# Patient Record
Sex: Male | Born: 1980 | Race: White | Hispanic: No | Marital: Married | Smoking: Current every day smoker
Health system: Southern US, Community
[De-identification: ages and names within clinical notes are randomized; demographics above are authoritative.]

## PROBLEM LIST (undated history)

## (undated) DIAGNOSIS — I319 Disease of pericardium, unspecified: Secondary | ICD-10-CM

## (undated) DIAGNOSIS — F32A Depression, unspecified: Secondary | ICD-10-CM

## (undated) DIAGNOSIS — R7303 Prediabetes: Secondary | ICD-10-CM

## (undated) DIAGNOSIS — M4802 Spinal stenosis, cervical region: Secondary | ICD-10-CM

## (undated) DIAGNOSIS — I10 Essential (primary) hypertension: Secondary | ICD-10-CM

## (undated) DIAGNOSIS — F419 Anxiety disorder, unspecified: Secondary | ICD-10-CM

---

## 2016-10-04 ENCOUNTER — Emergency Department: Payer: Managed Care, Other (non HMO)

## 2016-10-04 ENCOUNTER — Emergency Department
Admission: EM | Admit: 2016-10-04 | Discharge: 2016-10-04 | Disposition: A | Discharge status: Home / Self Care | Payer: Managed Care, Other (non HMO) | Attending: Emergency Medicine | Admitting: Emergency Medicine

## 2016-10-04 DIAGNOSIS — M545 Low back pain, unspecified: Secondary | ICD-10-CM

## 2016-10-04 MED ORDER — MORPHINE SULFATE (PF) 4 MG/ML IV SOLN
4.0000 mg | Freq: Once | INTRAVENOUS | Status: AC
  Administered 2016-10-04: 4 mg via INTRAVENOUS
  Filled 2016-10-04: qty 1

## 2016-10-04 MED ORDER — ONDANSETRON HCL 4 MG/2ML IJ SOLN
4.0000 mg | Freq: Once | INTRAMUSCULAR | Status: AC
  Administered 2016-10-04: 4 mg via INTRAVENOUS
  Filled 2016-10-04: qty 2

## 2016-10-04 MED ORDER — HYDROCODONE-ACETAMINOPHEN 5-325 MG PO TABS: 1.0000 | ORAL_TABLET | ORAL | 0 refills | Status: DC | PRN

## 2016-10-04 MED ORDER — PREDNISONE 20 MG PO TABS: 40.0000 mg | ORAL_TABLET | Freq: Every day | ORAL | 0 refills | Status: DC

## 2016-10-04 NOTE — ED Provider Notes (Signed)
-----------------------------------------   8:13 AM on 10/04/2016 -----------------------------------------  Patient care assumed from Dr. Manson PasseyBrown. CT scan of the back is negative for acute injury. 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 Suspect likely musculoskeletal exacerbation. We will place the patient on a short course of pain medication as well has prednisone. I discussed return precautions for worsening pain, weakness, numbness or incontinence. Patient agreeable with plan.   Minna AntisPaduchowski, Leib Elahi, MD 10/04/16 [phone]

## 2016-10-04 NOTE — ED Notes (Signed)
ED Provider at bedside. 

## 2016-10-04 NOTE — ED Notes (Signed)
Patient reports falling off pool ladder at approx 0200 today.

## 2016-10-04 NOTE — ED Provider Notes (Signed)
[facility] Emergency Department Provider Note   First MD Initiated Contact with Patient 10/04/16 [phone]     (approximate)  I have reviewed the triage vital signs and the nursing notes.   HISTORY  Chief Complaint Back Pain   HPI Douglas Garcia is a 36 y.o. male presents with history of falling off the ladder to enter his above ground pool at 2 AM this morning. Patient states it was approximately 4 feet and that he fell on his back. Patient admits to 10 out of 10 nonradiating low back pain worse with ambulation or any movement. Patient denies any lower extremity weakness or numbness. Patient denies any head injury no loss of consciousness.   Past medical history None There are no active problems to display for this patient.   Past surgical history  None  Prior to Admission medications   Not on File    Allergies No known drug allergies   Social History Social History  Substance Use Topics  . Smoking status: Not on file  . Smokeless tobacco: Not on file  . Alcohol use Not on file    Review of Systems Constitutional: No fever/chills Eyes: No visual changes. ENT: No sore throat. Cardiovascular: Denies chest pain. Respiratory: Denies shortness of breath. Gastrointestinal: No abdominal pain.  No nausea, no vomiting.  No diarrhea.  No constipation. Genitourinary: Negative for dysuria. Musculoskeletal: Negative for neck pain.  Positive for back pain. Integumentary: Negative for rash. Neurological: Negative for headaches, focal weakness or numbness.   ____________________________________________   PHYSICAL EXAM:  VITAL SIGNS: ED Triage Vitals  Enc Vitals Group     BP 10/04/16 0543 (!) 162/116     Pulse Rate 10/04/16 0543 (!) 123     Resp 10/04/16 0543 20     Temp 10/04/16 0543 98.6 F (37 C)     Temp Source 10/04/16 0543 Oral     SpO2 10/04/16 0543 94 %     Weight 10/04/16 0543 136.1 kg (300 lb)     Height 10/04/16 0543 1.854 m (6'  1")     Head Circumference --      Peak Flow --      Pain Score 10/04/16 0542 10     Pain Loc --      Pain Edu? --      Excl. in GC? --     Constitutional: Alert and oriented. Apparent discomfort Eyes: Conjunctivae are normal. Head: Atraumatic. Mouth/Throat: Mucous membranes are moist.  Oropharynx non-erythematous. Neck: No stridor.   Cardiovascular: Normal rate, regular rhythm. Good peripheral circulation. Grossly normal heart sounds. Respiratory: Normal respiratory effort.  No retractions. Lungs CTAB. Gastrointestinal: Soft and nontender. No distention.  Musculoskeletal: No lower extremity tenderness nor edema. No gross deformities of extremities. L1-5 pain with palpation. Neurologic:  Normal speech and language. No gross focal neurologic deficits are appreciated.  Skin:  Skin is warm, dry and intact. No rash noted. Psychiatric: Mood and affect are normal. Speech and behavior are normal.    RADIOLOGY I, [place] Shelvy Heckert, personally viewed and evaluated these images (plain radiographs) as part of my medical decision making, as well as reviewing the written report by the radiologist.  No results found.    Procedures   ____________________________________________   INITIAL IMPRESSION / ASSESSMENT AND PLAN / ED COURSE  Pertinent labs & imaging results that were available during my care of the patient were reviewed by me and considered in my medical decision making (see chart for details).  Patient given 4 mg IV morphine and Zofran secondary to 10 out of 10 low back pain. Given pain with palpation CT lumbar spine will be performed which is pending at this time. Patient's care transferred to Dr Lenard LancePaduchowski     ____________________________________________  FINAL CLINICAL IMPRESSION(S) / ED DIAGNOSES  Final diagnoses:  Acute low back pain without sciatica, unspecified back pain laterality     MEDICATIONS GIVEN DURING THIS VISIT:  Medications  morphine 4 MG/ML  injection 4 mg (4 mg Intravenous Given 10/04/16 0644)  ondansetron (ZOFRAN) injection 4 mg (4 mg Intravenous Given 10/04/16 0644)     NEW OUTPATIENT MEDICATIONS STARTED DURING THIS VISIT:  New Prescriptions   No medications on file    Modified Medications   No medications on file    Discontinued Medications   No medications on file     Note:  This document was prepared using Dragon voice recognition software and may include unintentional dictation errors.    Darci CurrentBrown, Devon N, MD 10/04/16 [phone]

## 2017-11-25 IMAGING — CT CT L SPINE W/O CM
3 of 4 series · 13 of 33 positions shown, 16 images · non-contrast
Comparison: None.

CLINICAL DATA: Status post fall ladder.  Back pain.

EXAM:
CT LUMBAR SPINE WITHOUT CONTRAST
TECHNIQUE: Multidetector CT imaging of the lumbar spine was performed without
intravenous contrast administration. Multiplanar CT image
reconstructions were also generated.

[Series 7: sagittal bone · sagittal · 0.45mm/px · 5 of 85 slices shown, 6 images]
[im 29/85  bone]
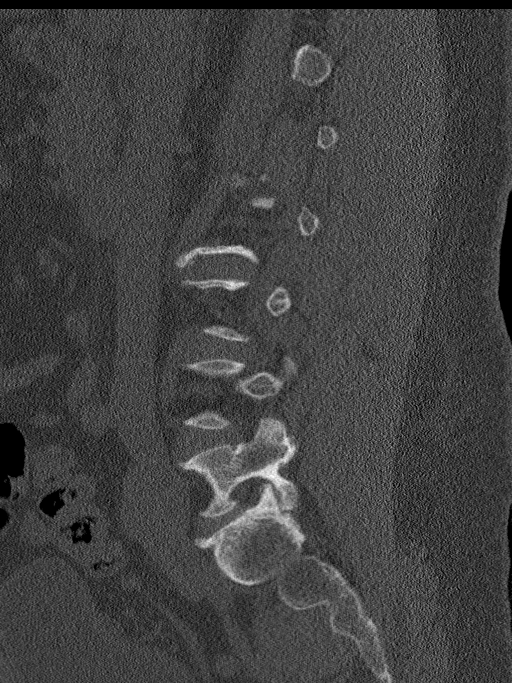
[im 36/85  bone]
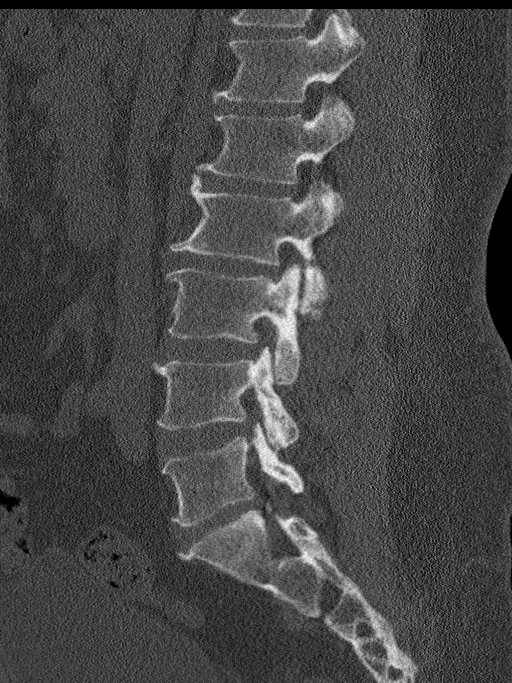
[im 43/85  soft-tissue]
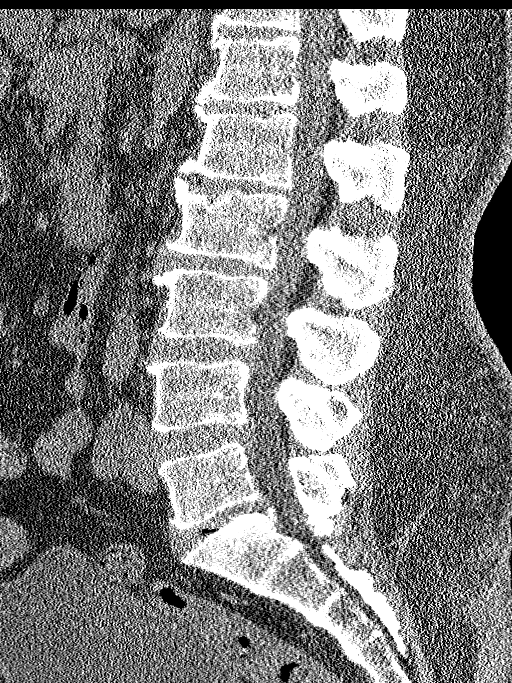
[im 43/85  bone]
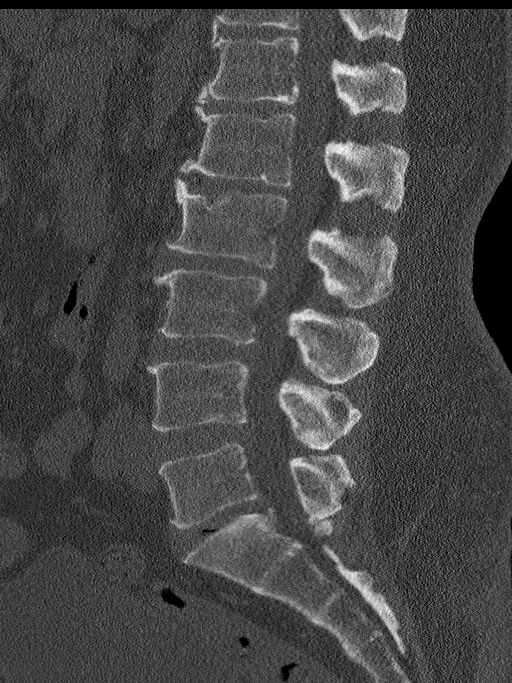
[im 50/85  bone]
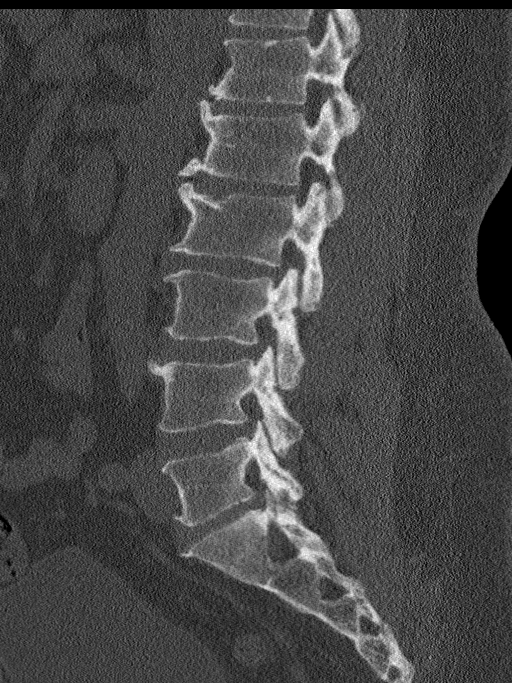
[im 57/85  bone]
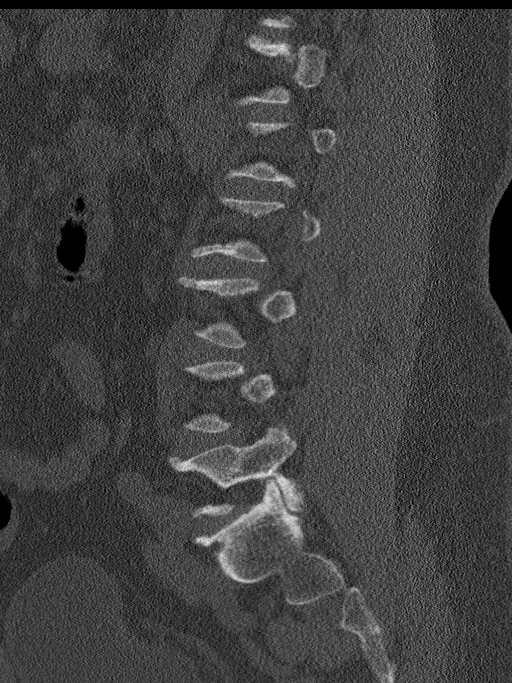

[Series 8: coronal bone · coronal · 0.45mm/px · 3 of 93 slices shown]
[im 19/93  bone]
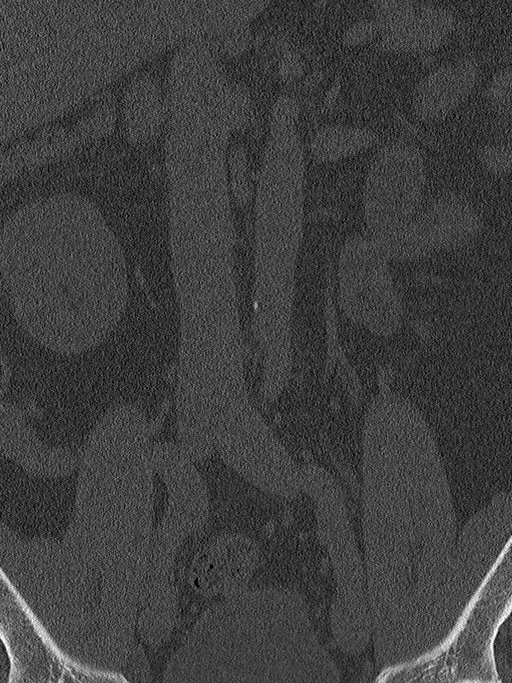
[im 37/93  bone]
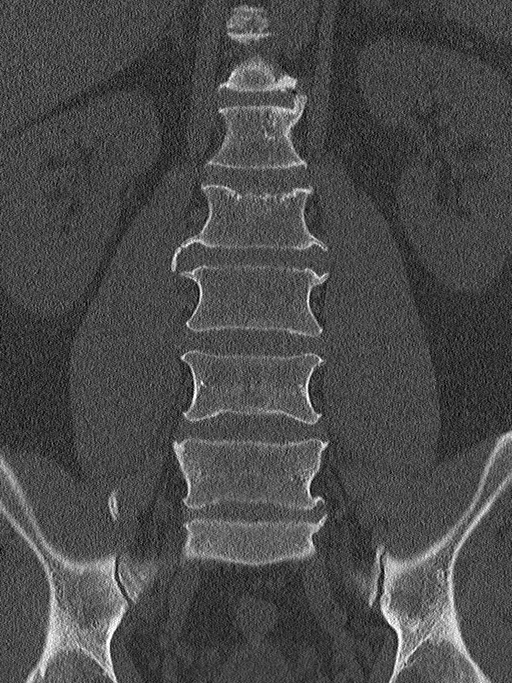
[im 56/93  bone]
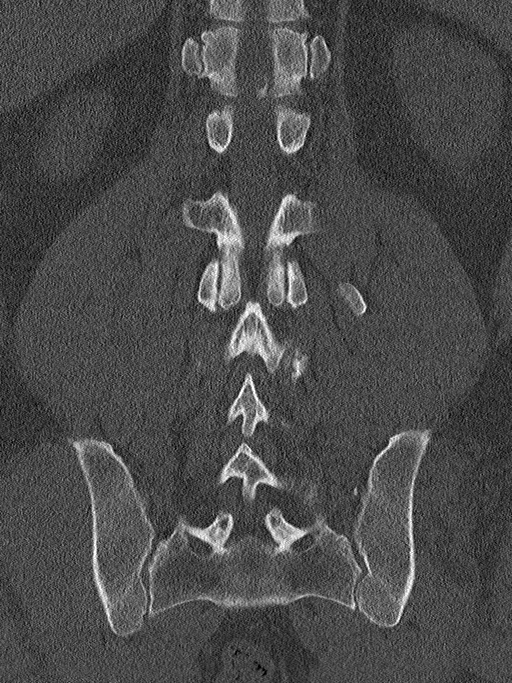

[Series 9: multi disc · axial · 0.21mm/px · z∈[-413,-216]mm · 5 of 137 slices shown, 7 images]
[im 23/137  soft-tissue]
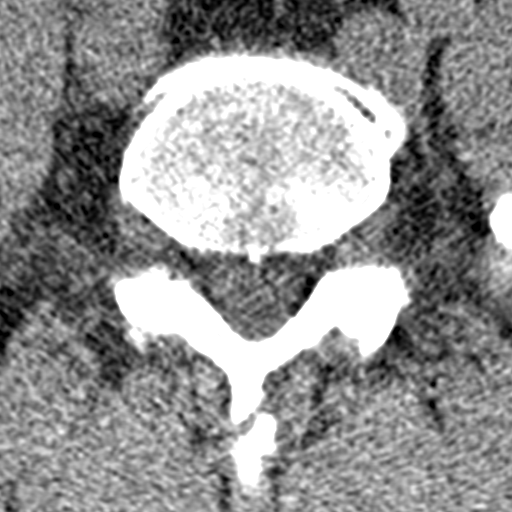
[im 23/137  bone]
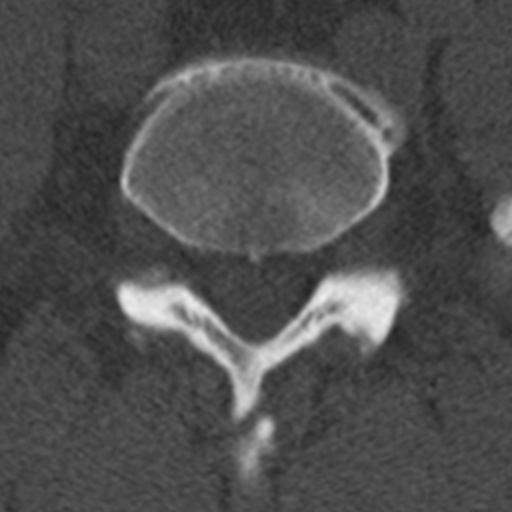
[im 46/137  bone]
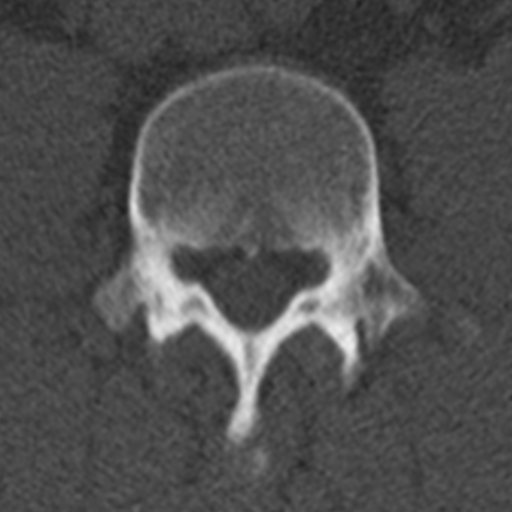
[im 69/137  bone]
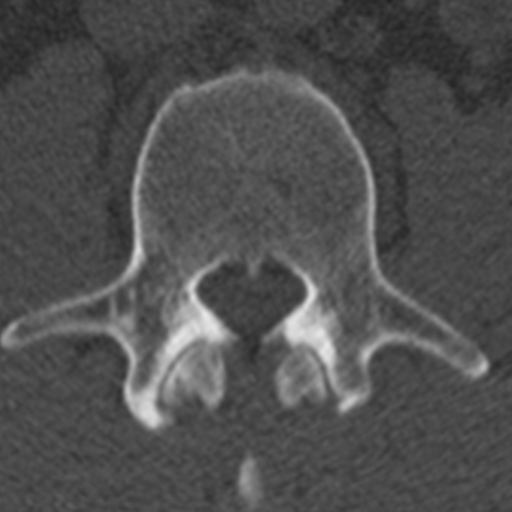
[im 91/137  bone]
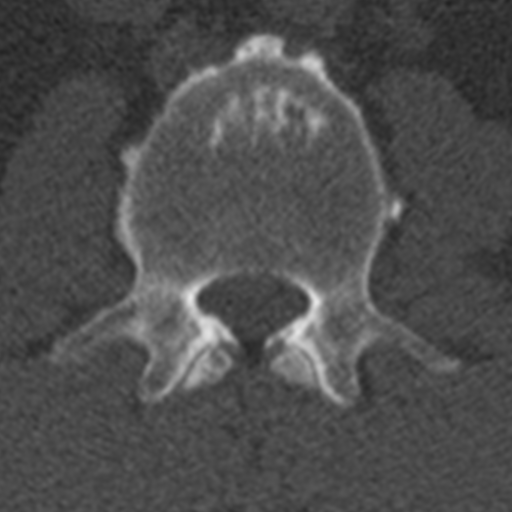
[im 114/137  soft-tissue]
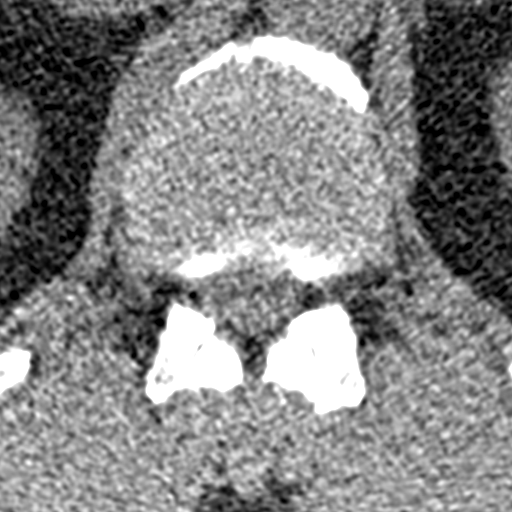
[im 114/137  bone]
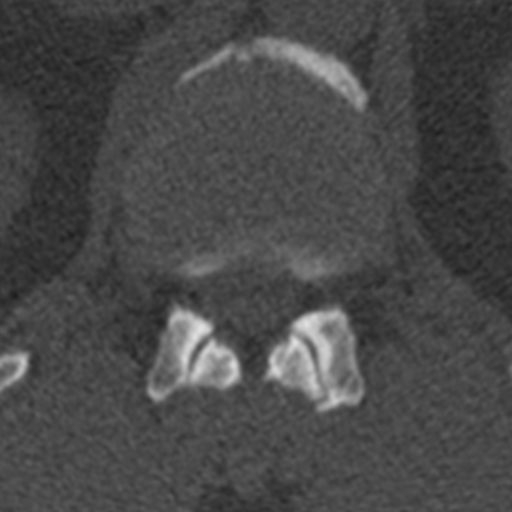

[13 of 33 positions shown; findings below may reference images not displayed]

FINDINGS: Segmentation: 5 lumbar type vertebrae.

Alignment: Normal.

Vertebrae: No acute fracture or focal pathologic process. There is
bilateral osteoarthritis of the sacroiliac joints. New

Paraspinal and other soft tissues: No paraspinal abnormality.
Minimal abdominal aortic atherosclerosis.

Disc levels: Mild degenerative disc disease with disc height loss at
T12-L1, L1-2, L2-3 L5-S1.

At T12-L1 there is a mild broad-based disc osteophyte complex. No
foraminal or central canal stenosis. At L1-2 there is no significant
disc protrusion foraminal stenosis or central canal stenosis. There
is mild bilateral facet arthropathy.

At L2-3 there is a broad-based disc bulge flattening the ventral
thecal sac. There is mild bilateral facet arthropathy. There is no
foraminal stenosis. There is mild spinal stenosis.

At L3-4 there is a mild broad-based disc bulge. There is mild
bilateral facet arthropathy. There is mild right foraminal stenosis.

At L4-5 there is no significant disc protrusion. There is a mild
broad-based disc bulge. There is moderate bilateral facet
arthropathy. There is no foraminal stenosis.

At L5-S1 there is a broad-based disc bulge more focal centrally.
There is moderate left and mild right facet arthropathy. There is no
foraminal stenosis.
IMPRESSION: 1. No acute osseous injury of the lumbar spine.
2. Lumbar spine spondylosis as described above.
3.  Aortic Atherosclerosis ([id number]-170.0)

## 2019-03-08 DIAGNOSIS — I10 Essential (primary) hypertension: Secondary | ICD-10-CM | POA: Insufficient documentation

## 2019-03-09 DIAGNOSIS — I517 Cardiomegaly: Secondary | ICD-10-CM | POA: Insufficient documentation

## 2020-06-11 DIAGNOSIS — Z720 Tobacco use: Secondary | ICD-10-CM | POA: Insufficient documentation

## 2020-06-11 DIAGNOSIS — F419 Anxiety disorder, unspecified: Secondary | ICD-10-CM | POA: Insufficient documentation

## 2021-08-19 DIAGNOSIS — R7303 Prediabetes: Secondary | ICD-10-CM | POA: Insufficient documentation

## 2022-02-24 ENCOUNTER — Other Ambulatory Visit (HOSPITAL_COMMUNITY): Payer: Self-pay | Admitting: Gastroenterology

## 2022-02-24 DIAGNOSIS — R17 Unspecified jaundice: Secondary | ICD-10-CM

## 2022-09-15 ENCOUNTER — Ambulatory Visit: Payer: Managed Care, Other (non HMO) | Admitting: Dietician

## 2023-07-28 DIAGNOSIS — S46211A Strain of muscle, fascia and tendon of other parts of biceps, right arm, initial encounter: Secondary | ICD-10-CM | POA: Insufficient documentation

## 2023-07-28 NOTE — Progress Notes (Signed)
 [facility] Chief Complaint:   Chief Complaint  Patient presents with   Cervical Spine - Pain    History of Present Illness:    Douglas Garcia is a 43 y.o. male that presents to clinic today for evaluation and management of neck and right arm symptoms.    At the time of this visit, there was no information to review.  Cervical Spine The patient reports that the symptoms began gradually without injury 7 weeks ago. The onset of pain was related to unknown. The patient's primary area of pain is in their lower neck, with pain also being present in their upper neck, lower neck and right arm. He describes his primary pain as achy, numbing and tingling, associated pain as achy, numbing and tingling with the timing of the primary pain as frequent and frequent for the associated pain.  Severity of primary pain is severe and moderate for associated pain.  Since onset the pain has stayed the same. On a scale of 0 (no pain) to 10 (worst pain ever), his pain is at a 7/10. lying down improves the pain. He states that sleep and work is/are affected by his pain/problem. He has tried the following prior/current treatments: heat and massage. Heat helps with the pain. Massage does not help with the pain. His last massage therapy session was not in the last 12 months. He has tried muscle relaxants, NSAIDs (anti-inflammatories) and pain meds for this problem. Motrin does not help with the pain. Percocet does not help with the pain. His current work status is working part-time without restrictions.  He denies any previous accident, injury, trauma to his neck, however, he has had a right elbow injury with suspected distal biceps tendon rupture treated nonsurgically because of timing from injury to consultation with the surgeon.  He reports associated numbness and tingling, pain at night.  He denies associated swelling, locking/catching, instability, weakness, fevers or chills,  night sweats weight loss, skin color change, gait change.  He has tried ibuprofen, oxycodone, tramadol, unknown muscle laxer, heat, massage, activity modification with ongoing symptoms.    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  He feels like his symptoms are tolerable but he does make concessions to activities to avoid pain.  Medications, Past Medical/Surgical/Family/Social History:   Current Outpatient Medications  Medication Sig Dispense Refill   cyclobenzaprine (FLEXERIL) 5 MG tablet Take 1 tablet (5 mg total) by mouth 2 (two) times daily as needed for Muscle spasms for up to 10 days 20 tablet 0   predniSONE  (DELTASONE ) 10 MG tablet Take 6 tabs (60mg ) by mouth followed by 5 tabs (50mg ), then 4 tabs (40mg ), then 3 tabs (30mg ), then 2 tabs (20mg ), then 1 tab (10mg ) 21 tablet 0   No current facility-administered medications for this visit.    SECONDARY CONDITIONS THAT INFLUENCE TREATMENT AND DECISION-MAKING:  Former smoker  Past Medical History: Overweight, hypertension, anxiety, depression, history of pericarditis  No relevant past Surgical History  Relevant Orthopedic Family History: Multiple sclerosis, osteoarthritis  Review of Systems:   A 10+ ROS was performed, reviewed, and the pertinent orthopaedic findings are documented in the HPI.    Physical Examination:   BP 120/68   Ht 185.4 cm (6' 1)   Wt 98.4 kg (217 lb)   BMI 28.63 kg/m  General/Constitutional: Well-nourished, well developed, no apparent distress. Psych: Normal mood and affect.  Conversant.  Judgement intact. Lymphatic: No palpable adenopathy. Respiratory: Normal respirations,  no retractions.  Cardiovascular: No edema or swelling. Musculoskeletal: Comprehensive Cervical Spine Exam: Inspection Posture Forward head position with increased cervical lordosis.  Rounded shoulders bilaterally.     Right Left  Skin Normal appearance with no obvious deformity.  No ecchymosis, erythema,  rash, or lesions. Normal appearance with no obvious deformity.  No ecchymosis, erythema, rash, or lesions.  Atrophy None None   Tenderness                                         Right Left  No bony or muscular TTP No bony or muscular TTP    ROM Flexion/Extension Normal  Rotation Normal  Sidebending Normal                    Special Tests   Right Left  Spurling Negative Negative  Hoffman Negative Negative    Motor   Right Left  Shoulder Abduction 5/5 5/5  Elbow Flexion 5/5 5/5  Elbow Extension 5/5 5/5  Wrist Extension 5/5 5/5  Wrist Flexion 5/5 5/5  Finger Abduction 5/5 5/5  Thumb Extension 5/5 5/5  Thumb Flexion 5/5 5/5  Grip Symmetric Symmetric   Sensory   Right Left  Light Touch Normal light touch sensation over C5-T2 dermatomes Normal light touch sensation over C5-T2 dermatomes   Reflexes   Right Left  Biceps 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  Mild straightening of the normal cervical lordosis.  Mild disc change at C5-6 with narrowing and osteophyte formation.  No facet hypertrophic changes.  There are no fractures or dislocations.  No soft tissue swelling.  I personally reviewed and visualized the imaging studies if available. I additionally personally interpreted any radiographs taken during today's visit.  Assessment:     ICD-10-CM  1. Cervicalgia  M54.2  2. Right arm numbness  R20.0  3. Other cervical disc degeneration at C5-C6 level  M50.322    Plan:   I have discussed the nature of his current subjective complaints, clinical examination, test results and have  reviewed treatment options.  The plan is to do the following;  - The patient has ongoing neck and right arm pains without memorable injury.  I discussed the possible causes of his symptoms related to cervical radiculitis versus myofascial pains versus subacromial impingement. - He does not have history concerning for myelopathy.  No abnormalities on neurologic exam.  Good neck range of motion.  X-ray of the cervical spine shows some mild disc changes at C5-6.  I discussed the possible causes of his symptoms with question of cervical radiculitis.  I explained the conservative treatment with activity modification, home exercise, physical therapy, medication prior to further evaluation with MRI for consideration of epidural steroid injection.  He has tried numerous medications with ongoing symptoms so we decided to try different classes. - Activity as tolerated. Modify activity as needed according to symptoms. No limitations to weight bearing.  No need for immobilization or bracing. - Continue home exercise program to maintain  strength, flexibility, and endurance. - I prescribed prednisone  and muscle relaxer.  Use Tylenol , anti-inflammatories, topical pain cream/patch, relative rest, compression, massage, and ice/heat as needed for pain.   - Follow up as needed in 4-6 weeks depending on response to treatment.  He may need low-dose gabapentin versus physical therapy versus cervical spine MRI and physiatry consultation.  Contact our office with any questions or concerns.  Follow up as indicated, or sooner should any new problems arise, if conditions worsen, or if they are otherwise concerned.    Prentice Reges, DO [facility] [address] [place], [place] [address] Phone: [phone]   This note was generated in part with voice recognition software and I apologize for any typographical errors that were not detected and corrected.

## 2023-08-26 ENCOUNTER — Other Ambulatory Visit: Payer: Self-pay | Admitting: Sports Medicine

## 2023-08-26 DIAGNOSIS — M5412 Radiculopathy, cervical region: Secondary | ICD-10-CM

## 2023-08-26 DIAGNOSIS — M50322 Other cervical disc degeneration at C5-C6 level: Secondary | ICD-10-CM

## 2023-09-01 ENCOUNTER — Ambulatory Visit
Admission: RE | Admit: 2023-09-01 | Discharge: 2023-09-01 | Disposition: A | Discharge status: Home / Self Care | Source: Ambulatory Visit | Attending: Sports Medicine | Admitting: Sports Medicine

## 2023-09-01 DIAGNOSIS — M5412 Radiculopathy, cervical region: Secondary | ICD-10-CM | POA: Diagnosis present

## 2023-09-01 DIAGNOSIS — M50322 Other cervical disc degeneration at C5-C6 level: Secondary | ICD-10-CM | POA: Diagnosis present

## 2023-10-31 NOTE — Progress Notes (Signed)
 Subjective:     Patient ID: Douglas Garcia is a 43 y.o. male.  Back Pain  : PRESENTS WITH RIGHT SIDED LOW BACK PAIN. REPORTS  HE WAS MOVING SOME FURNATURE A WK AGO. NON RADIATING. NO CHANGE IN BOWEL OR BLADDER CONTROL. WORSENED YESTERDAY . HA APPLIED ICE. HX OF CERVICAL STENOSIS . NO REAL LOW BACK ISSUES. RECORDS REVIEWED.   Past Medical History:  has a past medical history of Anxiety, Depression, Hypertension, Pericardial effusion ([facility]) (02/2019), Pericardial effusion without cardiac tamponade ([facility]) (03/08/2019), and Pericarditis, acute ([facility]) (03/08/2019). Past Surgical History:  has a past surgical history that includes Vasectomy (2019). Family History: family history includes ADD / ADHD in his son; Breast cancer in his maternal grandmother; High blood pressure (Hypertension) in his mother; Multiple sclerosis in his sister; Myocardial Infarction (Heart attack) in his maternal grandfather and paternal grandfather; No Known Problems in his father, paternal grandmother, and son; Obesity in his mother; Osteoarthritis in his mother. Social History:  reports that he quit smoking about 2 years ago. His smoking use included cigarettes. He started smoking about 26 years ago. He has a 24 pack-year smoking history. He has never used smokeless tobacco. He reports that he does not currently use alcohol. He reports current drug use. Drug: Other-see comments. Prior to encounter Medications:  Current Outpatient Medications on File Prior to Visit  Medication Sig Dispense Refill   gabapentin (NEURONTIN) 300 MG capsule Take 1 capsule (300 mg total) by mouth at bedtime for 30 days (Patient not taking: Reported on 10/31/2023) 30 capsule 0   pregabalin (LYRICA) 50 MG capsule 1 po qHS (Patient not taking: Reported on 10/31/2023) 30 capsule 0   No current facility-administered medications on file prior to visit.   Allergies: has no known allergies.  This an established patient is here today for: office  visit.  Review of Systems  Musculoskeletal:  Positive for back pain.       Objective:   Physical Exam Vitals (HT AND WT MIS MEASURES. PATIENT GO BEFORE RECHECK) reviewed.  Constitutional:      General: He is not in acute distress.    Appearance: Normal appearance. He is not ill-appearing or toxic-appearing.  Cardiovascular:     Rate and Rhythm: Normal rate.  Pulmonary:     Effort: Pulmonary effort is normal. No respiratory distress.     Breath sounds: No wheezing.  Musculoskeletal:     Comments: PAIN RIGHT QUADRATUS AREA.  HEEL TOE WALK INTACT  PAIN WITH FLEX TO 20 DEGREES AND WITH EXTENSION PAIN WITH SBR AND ROTATION LEFT  NEG SLR   Neurological:     Mental Status: He is alert.        Assessment:     1. Lumbar strain, initial encounter   -     predniSONE  (DELTASONE ) 20 MG tablet; Take 1 tablet (20 mg total) by mouth 2 (two) times daily for 5 days -     baclofen (LIORESAL) 10 MG tablet; 1 PO TID PRN -     traMADoL (ULTRAM) 50 mg tablet; Take 1 tablet (50 mg total) by mouth every 6 (six) hours as needed for up to 10 days     Plan:     Patient Instructions  Medications as discussed and directed  Apply heat three times daily x 15 min Do not start stretching until pain free , then only to the point of pain  Follow up with physiatry if not resolving.

## 2023-11-04 NOTE — Progress Notes (Signed)
 HPI The patient is a pleasant 43 year old right hand dominate gentleman followed by Dr.Kubisnki who presents today for follow-up of acute on chronic bilateral neck pain with more notable right lateral neck pain with radiation to the bilateral shoulders and intermittent diffuse numbness and tingling in his right arm.  His pain began in March 2025 without injury.  He did begin working at [employer] at this time but states that his symptoms began before working at [employer].  He rates his pain is moderate to severe that is intermittent with associated numbness and tingling unchanged with onset.  His pain is worse with standing, lifting, sitting.  Lying in bed can help to alleviate pain.  Pain is not interrupting rest.He denies any difficulty with fine motor task change in balance.   Medications have included Flexeril (no relief with stomach pain), prednisone  (good temporary relief), ibuprofen (mild relief), gabapentin caused drowsiness was not beneficial.  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  He has 2 boys ages 66 and 65.  He was initially evaluated on 09/22/2023 at which time he was experiencing acute on chronic bilateral neck pain right greater than left extending to the shoulders and tingling in the right arm.  He was started on Lyrica and [facility]  At today's visit he states he has 60% improvement of cervical spine pain with physical therapy.  He is planning to have another visit today.  He intermittently will experience a few seconds of numbness in the right upper extremity we discussed again epidural steroid injection he prefers to hold off at this time.  No difficulty with fine motor tasks.  He was evaluated and 10/31/2023 at the walk-in clinic for low back pain.  He was prescribed baclofen and prednisone  and states that this has been helpful along with tramadol.  He reports improvement of pain he is not interested in any further workup.  Denies any loss control of bowel  or bladder or saddle anesthesia.  Denies any fever or chills or night sweats or unintentional weight loss.  He also states he has intermittent right groin pain that will catch but he is not experiencing this now.  The   Narcotic Database was reviewed today.   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  Tobacco Use   Smoking status: Former    Current packs/day: 0.00    Average packs/day: 1 pack/day for 24.0 years (24.0 ttl pk-yrs)    Types: Cigarettes    Start date: 12/20/1996    Quit date: 12/2020    Years since quitting: 2.8   Smokeless tobacco: Never  Vaping Use   Vaping status: Never Used  Substance and Sexual Activity   Alcohol use: Not Currently    Comment: quit 2020   Drug use: Yes    Types: Other-see comments    Comment: marijuana couple times per week   Sexual activity: Yes    Partners: Female    Birth control/protection: Surgical    Comment: vasectomy   Social Drivers of Health   Financial Resource Strain: Low Risk  (10/31/2023)  Overall Financial Resource Strain (CARDIA)    Difficulty of Paying Living Expenses: Not hard at all  Food Insecurity: No Food Insecurity (10/31/2023)   Hunger Vital Sign    Worried About Running Out of Food in the Last Year: Never true    Ran Out of Food in the Last Year: Never true  Transportation Needs: No Transportation Needs (10/31/2023)   PRAPARE - Administrator, Civil Service (Medical): No    Lack of Transportation (Non-Medical): No    Family History  Problem Relation Name Age of Onset   High  blood pressure (Hypertension) Mother     Obesity Mother     Osteoarthritis Mother     No Known Problems Father     Multiple sclerosis Sister     ADD / ADHD Son Katrinka    Breast cancer Maternal Grandmother     Myocardial Infarction (Heart attack) Maternal Grandfather     No Known Problems Paternal Grandmother     Myocardial Infarction (Heart attack) Paternal Grandfather     No Known Problems Son Feliciano     Current Outpatient Medications on File Prior to Visit  Medication Sig Dispense Refill   baclofen (LIORESAL) 10 MG tablet 1 PO TID PRN 15 tablet 0   traMADoL (ULTRAM) 50 mg tablet Take 1 tablet (50 mg total) by mouth every 6 (six) hours as needed for up to 10 days 20 tablet 0   gabapentin (NEURONTIN) 300 MG capsule Take 1 capsule (300 mg total) by mouth at bedtime for 30 days (Patient not taking: Reported on 11/04/2023) 30 capsule 0   No current facility-administered medications on file prior to visit.    Allergies as of 11/04/2023   (No Known Allergies)    ROS More than 10 system, review of system form was given to the patient to fill out and has been signed by [employer] FNP-C and scanned into the patient's chart.   Vital signs Vitals:   11/04/23 0822  BP: 139/84  Pulse: 77  Temp: 36.6 C (97.8 F)  TempSrc: Oral  Weight: 83 kg (182 lb 15.7 oz)  Height: 177.8 cm (5' 10)  PainSc:   3  PainLoc: Back    Exam General: Alert oriented well-nourished no distress   Cervical Exam (performed 11/04/2023) Upon inspection there are no rashes or scars.  He is without notable tenderness to palpation to the cervical paraspinal musculature.  Cervical rotation is full.  Spurling's maneuver produces some left lateral neck pain.  Upper Extremity Exam (11/04/2023) He has 5/5 strength in bilateral wrist extensors, biceps, triceps, deltoids, shoulder internal and external rotators.  Sensation is intact to light touch bilaterally.  Negative Hoffmann's bilaterally.  Unable to  elicit bilateral bicep and tricep temperature as reflexes bilaterally.   Lumbar exam (11/04/2023) Upon inspection there are no rashes or scars.  He has mild tenderness of ovation to the right lumbosacral paraspinal musculature.  Extension rotation produces right groin pain.  Lower Extremity Exam He has 5/5 strength in bilateral dorsiflexors, knee extensors and hip flexors.  Sensation is intact to light touch bilaterally.  Trace bilateral patellar reflexes.  Straight leg raise negative bilaterally.  He has full range of motion to the bilateral hip joints without pain elicited.   Radiographic Data   Impression 1.  Acute on chronic bilateral neck pain with more notable right lateral neck pain with radiation to the bilateral shoulders and intermittent diffuse numbness and tingling in his right armMRI CERVICAL SPINE WITHOUT CONTRAST  TECHNIQUE:  Multiplanar, multisequence MR imaging of the cervical spine was  performed. No intravenous contrast was administered.  COMPARISON:  None Available.  FINDINGS:  Alignment: Straightening of the normal cervical lordosis. No  listhesis.  Vertebrae: Vertebral body height maintained without acute or chronic  fracture. Decreased T1 signal intensity within the visualized bone  marrow, nonspecific, but most commonly related to anemia, smoking or  obesity. No discrete or worrisome osseous lesions. Mild degenerative  reactive endplate changes present about the C5-6 interspace. No  other abnormal marrow edema.  Cord: Normal signal and morphology.  Posterior Fossa, vertebral arteries, paraspinal tissues:  Unremarkable.  Disc levels:  C2-C3: Unremarkable.  C3-C4: Small central disc protrusion minimally indents the ventral  thecal sac. No spinal stenosis. Foramina remain patent.  C4-C5: Tiny right paracentral disc protrusion minimally indents the  ventral thecal sac. Mild right-sided uncovertebral spurring. No  spinal stenosis. Foramina remain adequately  patent.  C5-C6: Degenerative intervertebral disc space narrowing with diffuse  disc osteophyte complex, asymmetric to the right. Posterior  component flattens and effaces the ventral thecal sac. Secondary  cord flattening, asymmetric to the right. No cord signal changes.  Resultant moderate canal with severe right worse than left C6  foraminal stenosis.  C6-C7: Tiny right paracentral disc protrusion minimally indents the  right ventral thecal sac (series 8, image 29). No significant spinal  stenosis. Foramina remain patent.  C7-T1: Normal interspace. Mild left-sided facet hyper no canal or  foraminal stenosis.  IMPRESSION:  1. Right eccentric degenerative disc osteophyte complex at C5-6 with  resultant moderate canal and severe right worse than left C6  foraminal stenosis.  2. Small right paracentral disc protrusions at C4-5 and C6-7 without  significant stenosis or overt neural impingement.  Electronically Signed    By: Morene Hoard M.D.    On: 09/06/2023 16:14    2.  Hypertension 3.  Anxiety depression 4.  Tobacco abuse  5.  Acute bilateral low back pain without rating pain to the lower extremities.  Clinically his symptoms were most consistent with muscle spasm.CT lumbar spine without contrast dated 10/04/2016 revealed multilevel degenerative changes and aortic atherosclerosis.  6.  Intermittent right groin pain.  Clinically his symptoms are most likely an element of right hip mediated pain.  Plan 1.  He discontinued Lyrica 50mg   was not helpful 2.  Continue with physical therapy  [facility]  3.  In the past Flexeril has caused urinary retention we will avoid muscle relaxants. 4.  We discussed xrays of back and right hip and he would like to hold off. He can call if he would like to move forward with these.  5.  Today we discussed epidural steroid injection.  If he was to wish to move forward with this he could call to schedule for right C5-6 versus C6-7  transforaminal ESI x 1.  We will prescribe Valium. 6.  He would like to follow-up as needed today.  Social Drivers of Health with Concerns   Tobacco Use: Medium Risk (10/31/2023)   Patient History    Smoking Tobacco Use: Former    Smokeless Tobacco Use: Never  Alcohol Use: Alcohol Misuse (10/31/2023)   AUDIT-C    Frequency of Alcohol Consumption: Monthly or less    Average Number of Drinks: 3 or 4    Frequency of Binge Drinking: Weekly  Depression: Moderately severe depression (08/19/2021)   PHQ-9    PHQ-9 Score: 16      Attestation Statement:   I personally  performed the service, non-incident to. (WP)   WHITNEY MEELER, NP    BENTON DOWSE, NP  This note was generated in part with voice recognition software and I apologize for any typographical errors that were not detected and corrected.  PCP: General

## 2024-03-21 ENCOUNTER — Ambulatory Visit: Admission: EM | Admit: 2024-03-21 | Discharge: 2024-03-21 | Disposition: A | Discharge status: Home / Self Care

## 2024-03-21 ENCOUNTER — Emergency Department
Admission: EM | Admit: 2024-03-21 | Discharge: 2024-03-21 | Disposition: A | Discharge status: Home / Self Care | Source: Ambulatory Visit

## 2024-03-21 ENCOUNTER — Other Ambulatory Visit: Payer: Self-pay

## 2024-03-21 DIAGNOSIS — M25551 Pain in right hip: Secondary | ICD-10-CM | POA: Diagnosis not present

## 2024-03-21 DIAGNOSIS — I10 Essential (primary) hypertension: Secondary | ICD-10-CM | POA: Diagnosis not present

## 2024-03-21 DIAGNOSIS — M5410 Radiculopathy, site unspecified: Secondary | ICD-10-CM

## 2024-03-21 DIAGNOSIS — M79604 Pain in right leg: Secondary | ICD-10-CM | POA: Diagnosis present

## 2024-03-21 DIAGNOSIS — R1031 Right lower quadrant pain: Secondary | ICD-10-CM | POA: Diagnosis not present

## 2024-03-21 DIAGNOSIS — M5431 Sciatica, right side: Secondary | ICD-10-CM | POA: Diagnosis not present

## 2024-03-21 HISTORY — DX: Disease of pericardium, unspecified: I31.9

## 2024-03-21 HISTORY — DX: Depression, unspecified: F32.A

## 2024-03-21 HISTORY — DX: Spinal stenosis, cervical region: M48.02

## 2024-03-21 HISTORY — DX: Essential (primary) hypertension: I10

## 2024-03-21 HISTORY — DX: Anxiety disorder, unspecified: F41.9

## 2024-03-21 HISTORY — DX: Prediabetes: R73.03

## 2024-03-21 MED ORDER — KETOROLAC TROMETHAMINE 15 MG/ML IJ SOLN
15.0000 mg | Freq: Once | INTRAMUSCULAR | Status: AC
  Administered 2024-03-21: 15 mg via INTRAMUSCULAR
  Filled 2024-03-21: qty 1

## 2024-03-21 MED ORDER — PREDNISONE 20 MG PO TABS
60.0000 mg | ORAL_TABLET | Freq: Once | ORAL | Status: AC
  Administered 2024-03-21: 60 mg via ORAL
  Filled 2024-03-21: qty 3

## 2024-03-21 MED ORDER — PREDNISONE 20 MG PO TABS: 20.0000 mg | ORAL_TABLET | Freq: Two times a day (BID) | ORAL | 0 refills | Status: AC

## 2024-03-21 MED ORDER — METHOCARBAMOL 500 MG PO TABS: 500.0000 mg | ORAL_TABLET | Freq: Four times a day (QID) | ORAL | 0 refills | Status: AC

## 2024-03-21 MED ORDER — OXYCODONE-ACETAMINOPHEN 5-325 MG PO TABS
2.0000 | ORAL_TABLET | Freq: Once | ORAL | Status: AC
  Administered 2024-03-21: 2 via ORAL
  Filled 2024-03-21: qty 2

## 2024-03-21 MED ORDER — OXYCODONE-ACETAMINOPHEN 7.5-325 MG PO TABS: 1.0000 | ORAL_TABLET | ORAL | 0 refills | Status: AC | PRN

## 2024-03-21 NOTE — ED Triage Notes (Signed)
 Pt presents to ED from urgent care C/O R hip pain radiating down R leg X 3 days. Worse with movement/walking. Denies known injury.

## 2024-03-21 NOTE — ED Provider Notes (Signed)
 " [facility]    CSN: [id number] Arrival date & time: 03/21/24  9178      History   Chief Complaint Chief Complaint  Patient presents with   Leg Pain    HPI Douglas Garcia. is a 43 y.o. male.  Patient presents with 3-day history of right hip and groin pain which radiates down his right leg.  No trauma.  He woke up with the pain.  He has attempted treatment with ibuprofen and gabapentin without relief.  His pain occurs when he changes position, walks, stands.  It improves with rest.  He denies numbness, weakness, saddle anesthesia, loss of bowel/bladder control, fever, abdominal pain, dysuria, hematuria, flank pain, testicular pain.  His medical history includes cervical spinal stenosis, pericarditis, hypertension, current smoker, anxiety, depression, prediabetes.  The history is provided by the patient and medical records.    Past Medical History:  Diagnosis Date   Anxiety    Cervical spinal stenosis    Depression    Hypertension    Pericarditis    Prediabetes     There are no active problems to display for this patient.   History reviewed. No pertinent surgical history.     Home Medications    Prior to Admission medications  Medication Sig Start Date End Date Taking? Authorizing Provider  HYDROcodone -acetaminophen  (NORCO/VICODIN) 5-325 MG tablet Take 1 tablet by mouth every 4 (four) hours as needed. Patient not taking: Reported on 03/21/2024 10/04/16   Dorothyann Drivers, MD  predniSONE  (DELTASONE ) 20 MG tablet Take 2 tablets (40 mg total) by mouth daily. Patient not taking: Reported on 03/21/2024 10/04/16   Dorothyann Drivers, MD    Family History History reviewed. No pertinent family history.  Social History Social History[1]   Allergies   Patient has no known allergies.   Review of Systems Review of Systems  Constitutional:  Negative for chills and fever.  Gastrointestinal:  Negative for abdominal pain, constipation, diarrhea, nausea and  vomiting.  Genitourinary:  Negative for decreased urine volume, dysuria, hematuria, scrotal swelling and testicular pain.  Musculoskeletal:  Positive for arthralgias and gait problem. Negative for joint swelling.  Skin:  Negative for color change, rash and wound.  Neurological:  Negative for weakness and numbness.     Physical Exam Triage Vital Signs ED Triage Vitals  Encounter Vitals Group     BP 03/21/24 0851 (!) 151/101     Girls Systolic BP Percentile --      Girls Diastolic BP Percentile --      Boys Systolic BP Percentile --      Boys Diastolic BP Percentile --      Pulse Rate 03/21/24 0851 92     Resp 03/21/24 0851 18     Temp 03/21/24 0851 98.2 F (36.8 C)     Temp src --      SpO2 03/21/24 0851 98 %     Weight --      Height --      Head Circumference --      Peak Flow --      Pain Score 03/21/24 0853 3     Pain Loc --      Pain Education --      Exclude from Growth Chart --    No data found.  Updated Vital Signs BP (!) 151/101   Pulse 92   Temp 98.2 F (36.8 C)   Resp 18   SpO2 98%   Visual Acuity Right Eye Distance:  Left Eye Distance:   Bilateral Distance:    Right Eye Near:   Left Eye Near:    Bilateral Near:     Physical Exam Constitutional:      General: He is not in acute distress. HENT:     Mouth/Throat:     Mouth: Mucous membranes are moist.  Cardiovascular:     Rate and Rhythm: Normal rate.  Pulmonary:     Effort: Pulmonary effort is normal. No respiratory distress.  Abdominal:     General: Bowel sounds are normal.     Palpations: Abdomen is soft.     Tenderness: There is no abdominal tenderness. There is no right CVA tenderness, left CVA tenderness, guarding or rebound.  Musculoskeletal:        General: No swelling, tenderness or deformity. Normal range of motion.  Skin:    Capillary Refill: Capillary refill takes less than 2 seconds.     Findings: No bruising, erythema, lesion or rash.  Neurological:     General: No focal  deficit present.     Mental Status: He is alert.     Sensory: No sensory deficit.     Motor: No weakness.     Gait: Gait normal.      UC Treatments / Results  Labs (all labs ordered are listed, but only abnormal results are displayed) Labs Reviewed - No data to display  EKG   Radiology No results found.  Procedures Procedures (including critical care time)  Medications Ordered in UC Medications - No data to display  Initial Impression / Assessment and Plan / UC Course  I have reviewed the triage vital signs and the nursing notes.  Pertinent labs & imaging results that were available during my care of the patient were reviewed by me and considered in my medical decision making (see chart for details).    Right hip pain, right groin pain, right side sciatica.  No trauma.  Patient declines treatment with NSAID and muscle relaxer.  He also states the previously prescribed gabapentin that he has been taking has not been helpful.  Discussed ED evaluation for uncontrolled pain or follow-up with his orthopedist at [facility].  He states he will likely go to the emergency department.  Final Clinical Impressions(s) / UC Diagnoses   Final diagnoses:  Right hip pain  Right groin pain  Sciatica of right side     Discharge Instructions      You decline treatment with a muscle relaxer and NSAID.  Go to the emergency department if you have uncontrolled pain.     ED Prescriptions   None    I have reviewed the PDMP during this encounter.    [1]  Social History Tobacco Use   Smoking status: Some Days    Types: Cigarettes  Vaping Use   Vaping status: Never Used  Substance Use Topics   Alcohol use: Not Currently   Drug use: Not Currently     Corlis Burnard DEL, NP 03/21/24 9072  "

## 2024-03-21 NOTE — ED Triage Notes (Addendum)
 Patient to Urgent Care with complaints of right sided leg pain. Reports pain started on Sunday morning when he woke up. Pain/ tingling/ pins and needles start and hip and radiates into toes. Worse w/ walking and lying flat.   Took a left over gabapentin from a previous prescription w/o relief/ ibuprofen.

## 2024-03-21 NOTE — Discharge Instructions (Signed)
 You decline treatment with a muscle relaxer and NSAID.  Go to the emergency department if you have uncontrolled pain.

## 2024-03-21 NOTE — ED Provider Notes (Signed)
 "  [facility] Provider Note    Event Date/Time   First MD Initiated Contact with Patient 03/21/24 1002     (approximate)   History   Leg Pain   HPI  Douglas Garcia. is a 43 y.o. male with a past medical history of anxiety, depression, hypertension, pericardial effusion and prior cervical neck problems who presents to the emergency department from urgent care with 3 days of pain that radiates from his back across his buttocks into his right hip groin and into his leg.  Patient states that pain has kept him from sleeping.  He he had leftover gabapentin from his prior neck pain and he took 2 doses of 600 mg each last night which did not help his pain.  He denies any redness to the area, any decrease strength in the extremity, any numbness in the leg and in the saddle region, no urinary incontinence or bowel incontinence or difficulty using the restroom.  He has no prior history of IV drug use and denies any recent infection.  He was seen at urgent care facility earlier today and was offered an NSAID and muscle relaxer but declined this and stated he was going to the emergency department for uncontrolled pain.  He presents with his wife who contributes to the history.  He does have an orthopedist that he sees regularly at [facility]      Physical Exam   Triage Vital Signs: ED Triage Vitals  Encounter Vitals Group     BP 03/21/24 0939 (!) 156/92     Girls Systolic BP Percentile --      Girls Diastolic BP Percentile --      Boys Systolic BP Percentile --      Boys Diastolic BP Percentile --      Pulse Rate 03/21/24 0939 91     Resp 03/21/24 0939 16     Temp 03/21/24 0939 98.2 F (36.8 C)     Temp Source 03/21/24 0939 Oral     SpO2 03/21/24 0939 100 %     Weight 03/21/24 0937 230 lb (104.3 kg)     Height 03/21/24 0937 6' 1 (1.854 m)     Head Circumference --      Peak Flow --      Pain Score 03/21/24 0937 4     Pain Loc --      Pain Education --       Exclude from Growth Chart --     Most recent vital signs: Vitals:   03/21/24 0939  BP: (!) 156/92  Pulse: 91  Resp: 16  Temp: 98.2 F (36.8 C)  SpO2: 100%    Nursing Triage Note reviewed. Vital signs reviewed and patients oxygen saturation is normoxic  General: Patient is well nourished, well developed, awake and alert, resting comfortably in no acute distress Head: Normocephalic and atraumatic Eyes: Normal inspection, extraocular muscles intact, no conjunctival pallor Ear, nose, throat: Normal external exam Neck: Normal range of motion Respiratory: Patient is in no respiratory distress, lungs CTAB Cardiovascular: Patient is not tachycardic, RRR without murmur appreciated GI: Abd SNT with no guarding or rebound  GU: Completed with nurse Penne as chaperone in the room, no penile or scrotal abnormalities Back: Normal inspection of the back with good strength and range of motion throughout all ext Extremities: pulses intact with good cap refills, no LE pitting edema or calf tenderness RLE: No swelling or erythema over hip, thigh or leg, he is  able to ambulate and bear weight.  Full range of motion of the hip knee and ankle.  Patient has 2+ DP pulses with less than 2-second cap refill in all digits Neuro: The patient is alert and oriented to person, place, and time, appropriately conversive, with 5/5 bilat UE/LE strength, no gross motor or sensory defects noted. Coordination appears to be adequate. Skin: Warm, dry, and intact Psych: normal mood and affect, no SI or HI  ED Results / Procedures / Treatments   Labs (all labs ordered are listed, but only abnormal results are displayed) Labs Reviewed - No data to display   EKG None  RADIOLOGY None    PROCEDURES:  Critical Care performed: No  Procedures   MEDICATIONS ORDERED IN ED: Medications  ketorolac (TORADOL) 15 MG/ML injection 15 mg (15 mg Intramuscular Given 03/21/24 1033)  oxyCODONE-acetaminophen   (PERCOCET/ROXICET) 5-325 MG per tablet 2 tablet (2 tablets Oral Given 03/21/24 1032)  predniSONE  (DELTASONE ) tablet 60 mg (60 mg Oral Given 03/21/24 1032)     IMPRESSION / MDM / ASSESSMENT AND PLAN / ED COURSE                                Differential diagnosis includes, but is not limited to, sciatica, arthritis of hip, radiculopathy  ED course: Patient presents from urgent care with uncontrolled pain that radiates from his buttocks down into his leg.  He has no focal neurological deficits on exam and that extremity is well-perfused without any evidence of erythema or infection.  I did consider obtaining x-ray imaging today however I do not think this would be helpful as patient is able to bear weight and I have no concern for fracture.  I did consider obtaining an MRI of the spine for consideration of epidural hematoma or abscess however he has no focal neurologic deficits and no risk factors for such.  His exam is not consistent with cauda equina at this time and I do not think he qualifies for an emergent MRI.  I have administered Percocet, steroids and Toradol with improvement in his symptoms.  Will send him home with prescription for Percocet , burst of prednisone  and methocarbamol and I have advised him to follow-up with a spine surgeon as I suspect his symptoms are secondary most to radiculopathy.  All questions answered and patient voiced understanding and requested discharge.  His wife will drive him home   Clinical Course as of 03/21/24 1545  Wed Mar 21, 2024  1100 Patient reassessed feels improved.  Feels comfortable returning home.  Repeat exam demonstrates no spinal tenderness, no weakness in extremity no sensation deficits.  Will give him an outpatient number to a spine surgeon.  Will send him home with scripts for Percocet and a course of steroids.  He was extensively counseled with his wife on signs of cauda equina and will return with any concerning symptoms.  He was also  counseled not to drive or operate machinery while taking the Percocets.  All questions answered and all feel comfortable returning home at this time [HD]    Clinical Course User Index [HD] Nicholaus Rolland BRAVO, MD   At time of discharge there is no evidence of acute life, limb, vision, or fertility threat. Patient has stable vital signs, pain is well controlled, patient is ambulatory and p.o. tolerant.  Discharge instructions were completed using the EPIC system. I would refer you to those at this time. All warnings  prescriptions follow-up etc. were discussed in detail with the patient. Patient indicates understanding and is agreeable with this plan. All questions answered.  Patient is made aware that they may return to the emergency department for any worsening or new condition or for any other emergency.  -- Risk: 5 This patient has a high risk of morbidity due to further diagnostic testing or treatment. Rationale: This patients evaluation and management involve a high risk of morbidity due to the potential severity of presenting symptoms, need for diagnostic testing, and/or initiation of treatment that may require close monitoring. The differential includes conditions with potential for significant deterioration or requiring escalation of care. Treatment decisions in the ED, including medication administration, procedural interventions, or disposition planning, reflect this level of risk. COPA: 5 The patient has the following acute or chronic illness/injury that poses a possible threat to life or bodily function: [X] : The patient has a potentially serious acute condition or an acute exacerbation of a chronic illness requiring urgent evaluation and management in the Emergency Department. The clinical presentation necessitates immediate consideration of life-threatening or function-threatening diagnoses, even if they are ultimately ruled out.   FINAL CLINICAL IMPRESSION(S) / ED DIAGNOSES   Final  diagnoses:  Radicular pain  Right leg pain     Rx / DC Orders   ED Discharge Orders          Ordered    predniSONE  (DELTASONE ) 20 MG tablet  2 times daily with meals        03/21/24 1104    oxyCODONE-acetaminophen  (PERCOCET) 7.5-325 MG tablet  Every 4 hours PRN        03/21/24 1104    methocarbamol (ROBAXIN) 500 MG tablet  4 times daily        03/21/24 1104    Ambulatory Referral to Primary Care (Establish Care)        03/21/24 1104             Note:  This document was prepared using Dragon voice recognition software and may include unintentional dictation errors.   Nicholaus Rolland BRAVO, MD 03/21/24 [phone]  "

## 2024-03-21 NOTE — Discharge Instructions (Signed)
 Try to get yourself into physical therapy and follow-up with a spine surgeon as soon as possible.  RETURN PRECAUTIONS & AFTERCARE: (ENGLISH) RETURN PRECAUTIONS: Return immediately to the emergency department or see/call your doctor if you feel worse, weak or have changes in speech or vision, are short of breath, have fever, vomiting, pain, bleeding or dark stool, trouble urinating or any new issues. Return here or see/call your doctor if not improving as expected for your suspected condition. FOLLOW-UP CARE: Call your doctor and/or any doctors we referred you to for more advice and to make an appointment. Do this today, tomorrow or after the weekend. Some doctors only take PPO insurance so if you have HMO insurance you may want to contact your HMO or your regular doctor for referral to a specialist within your plan. Either way tell the doctor's office that it was a referral from the emergency department so you get the soonest possible appointment.  YOUR TEST RESULTS: Take result reports of any blood or urine tests, imaging tests and EKG's to your doctor and any referral doctor. Have any abnormal tests repeated. Your doctor or a referral doctor can let you know when this should be done. Also make sure your doctor contacts this hospital to get any test results that are not currently available such as cultures or special tests for infection and final imaging reports, which are often not available at the time you leave the ER but which may list additional important findings that are not documented on the preliminary report. BLOOD PRESSURE: If your blood pressure was greater than 120/80 have your blood pressure rechecked within 1 to 2 weeks. MEDICATION SIDE EFFECTS: Do not drive, walk, bike, take the bus, etc. if you have received or are being prescribed any sedating medications such as those for pain or anxiety or certain antihistamines like Benadryl. If you have been give one of these here get a taxi home or  have a friend drive you home. Ask your pharmacist to counsel you on potential side effects of any new medication

## 2024-04-02 NOTE — Progress Notes (Unsigned)
 "  Referring Physician:  No referring provider defined for this encounter.  Primary Physician:  Pcp, No  History of Present Illness: 04/04/2024 [name] has a history of HTN, left ventricular hypertrophy, anxiety, depression, prediabetes, pericarditis.   Seen in ED at [facility] (referred by [facility]) on 03/21/24 for back and right leg pain. He was sent home with robaxin , percocet 7.5, and tapered prednisone .   Seen back in ED at [facility] on 03/25/24. In the ED he had a trans gluteal nerve block on the right. He was referred to PT. He was given baclofen and oxycodone .   He has constant right sided LBP with constant right  leg pain (posterior to knee and anterior to foot) to his foot. LBP started a week ago, right leg pain started almost 3 weeks ago. No numbness or tingling, but right leg feels weak. He has pain with putting weight on his right leg due to pain. No known injury.   Pain is worse with walking, laying down, and stretching. He did not have any improvement with nerve block in ED. No alleviating factors.   After taking robaxin , percocet 7.5, and tapered prednisone  from first ED visit, he noted abdominal swelling and weight gain with red rash. He stopped prednisone  and this improved, but he thinks he still has some swelling.   He works at [employer].   Tobacco use: smokes 1/2 PPD x 30 years.   Bowel/Bladder Dysfunction: none  Conservative measures:  Physical therapy: has not participated for right leg pain Multimodal medical therapy including regular antiinflammatories:  Baclofen, Oxycodone , Percocet, Ibuprofen, Gabapentin, Robaxin , Prednisone  Injections: no epidural steroid injections  Past Surgery: no spine surgeries  Douglas Garcia. has no symptoms of cervical myelopathy.  The symptoms are causing a significant impact on the patient's life.   Review of Systems:  A 10 point review of systems is negative, except for the pertinent positives and negatives  detailed in the HPI.  Past Medical History: Past Medical History:  Diagnosis Date   Anxiety    Cervical spinal stenosis    Depression    Hypertension    Pericarditis    Prediabetes     Past Surgical History: No past surgical history on file.  Allergies: Allergies as of 04/04/2024   (No Known Allergies)    Medications: Outpatient Encounter Medications as of 04/04/2024  Medication Sig   [DISCONTINUED] baclofen (LIORESAL) 10 MG tablet Take 10 mg by mouth 3 (three) times daily as needed.   [DISCONTINUED] gabapentin (NEURONTIN) 300 MG capsule Take 300 mg by mouth daily.   [DISCONTINUED] traMADol (ULTRAM) 50 MG tablet Take 50 mg by mouth every 6 (six) hours as needed.   [DISCONTINUED] HYDROcodone -acetaminophen  (NORCO/VICODIN) 5-325 MG tablet Take 1 tablet by mouth every 4 (four) hours as needed. (Patient not taking: Reported on 03/21/2024)   [DISCONTINUED] predniSONE  (DELTASONE ) 20 MG tablet Take 2 tablets (40 mg total) by mouth daily. (Patient not taking: Reported on 03/21/2024)   No facility-administered encounter medications on file as of 04/04/2024.    Social History: Social History[1]  Family Medical History: No family history on file.  Physical Examination: Vitals:   04/04/24 1409  BP: 134/84    General: Patient is well developed, well nourished, calm, collected, and in no apparent distress. Attention to examination is appropriate.  Respiratory: Patient is breathing without any difficulty.   NEUROLOGICAL:     Awake, alert, oriented to person, place, and time.  Speech is clear and fluent. Fund of  knowledge is appropriate.   Cranial Nerves: Pupils equal round and reactive to light.  Facial tone is symmetric.    No posterior lumbar tenderness. No tenderness over TL junction.   No abnormal lesions on exposed skin.   Strength: Side Biceps Triceps Deltoid Interossei Grip Wrist Ext. Wrist Flex.  R 5 5 5 5 5 5 5   L 5 5 5 5 5 5 5    Side Iliopsoas Quads Hamstring  PF DF EHL  R 5 5 5 5 5 5   L 5 5 5 5 5 5    Reflexes are 1+ and symmetric at the biceps, brachioradialis, patella and achilles.   Hoffman's is absent.  Clonus is not present.   Bilateral upper and lower extremity sensation is intact to light touch.     No pain with IR/ER of both hips.   Gait is slow.   Medical Decision Making  Imaging: Lumbar xrays dated 03/25/24:  FINDINGS:  There are 5 nonrib-bearing lumbar-type vertebral bodies. No evidence of static listhesis. Minimal anterior compression at L1. Vertebral body heights are otherwise well preserved. Anterolateral osteophytes most pronounced at L2-3 level Moderate colonic stool burden.  IMPRESSION: Age-indeterminate minimal anterior compression deformity of L1. Correlate with history and point tenderness.  Electronically Reviewed by: Wanna Hoit, MD, [facility] Electronically Reviewed on: 03/25/2024 4:50 PM   I have personally reviewed the images and agree with the above interpretation.  Assessment and Plan: Douglas Garcia has constant right sided LBP with constant right  leg pain (posterior to knee and anterior to foot) to his foot. LBP started a week ago, right leg pain started almost 3 weeks ago. No numbness or tingling, but right leg feels weak.   Lumbar xrays show spondylosis. I don't appreciated any significant compression of L1, but agree it appears old. Exam not consistent with acute compression fracture.   Treatment options discussed with patient and following plan made:   - MRI of lumbar spine to further evaluate right leg pain. Will also evaluate possible L1 compression.  - No relief with prednisone  or muscle relaxers (with question of allergic reaction).  - Limited script for oxycodone  given. Reviewed dosing and side effects. Do not drive or work while taking this medication. PMP reviewed and is appropriate.  - Discussed neurontin and he declines. Did not help in the past.  - Depending on MRI results, may consider  injections and/or PT. Will plan for in person follow up with me once I have results back to review them.  - He is working for now, but will let me know if he needs to be pulled.  Of note, after taking robaxin , percocet 7.5, and tapered prednisone  from first ED visit, he noted abdominal swelling and weight gain with red rash. He stopped prednisone  and this improved, but he thinks he still has some swelling. Prednisone  added as possible allergy and he was advised to follow up with PCP.   I spent a total of 45 minutes in face-to-face and non-face-to-face activities related to this patient's care today including review of outside records, review of imaging, review of symptoms, physical exam, discussion of differential diagnosis, discussion of treatment options, and documentation.   Thank you for involving me in the care of this patient.   Glade Boys PA-C Dept. of Neurosurgery      [1]  Social History Tobacco Use   Smoking status: Every Day    Types: Cigarettes   Smokeless tobacco: Never  Vaping Use   Vaping status: Never Used  Substance Use Topics   Alcohol use: Not Currently   Drug use: Not Currently   "

## 2024-04-03 ENCOUNTER — Other Ambulatory Visit: Payer: Self-pay

## 2024-04-03 ENCOUNTER — Inpatient Hospital Stay
Admission: RE | Admit: 2024-04-03 | Discharge: 2024-04-03 | Disposition: A | Payer: Self-pay | Source: Ambulatory Visit | Attending: Orthopedic Surgery | Admitting: Orthopedic Surgery

## 2024-04-03 DIAGNOSIS — Z049 Encounter for examination and observation for unspecified reason: Secondary | ICD-10-CM

## 2024-04-04 ENCOUNTER — Encounter: Payer: Self-pay | Admitting: Orthopedic Surgery

## 2024-04-04 ENCOUNTER — Ambulatory Visit: Admitting: Orthopedic Surgery

## 2024-04-04 VITALS — BP 134/84 | Ht 73.0 in | Wt 237.0 lb

## 2024-04-04 DIAGNOSIS — M5416 Radiculopathy, lumbar region: Secondary | ICD-10-CM

## 2024-04-04 DIAGNOSIS — M4726 Other spondylosis with radiculopathy, lumbar region: Secondary | ICD-10-CM

## 2024-04-04 DIAGNOSIS — M47816 Spondylosis without myelopathy or radiculopathy, lumbar region: Secondary | ICD-10-CM

## 2024-04-04 MED ORDER — OXYCODONE HCL 5 MG PO TABS: 5.0000 mg | ORAL_TABLET | Freq: Three times a day (TID) | ORAL | 0 refills | Status: DC | PRN

## 2024-04-04 NOTE — Patient Instructions (Signed)
 It was so nice to see you today. Thank you so much for coming in.    You have some wear and tear in your back. I don't think you have a new broken bone at L1, but will see more on MRI.   I want to get an MRI of your lower back to look into things further. We will get this approved through your insurance and [facility] will call you to schedule the appointment. Ask about your patient responsibility. You do not need to pay this prior to getting MRI, they can bill you.   [facility] is located at [address] in [place]. This is near the intersection of [address] and [address].   After you have the MRI, it can take 14-28 days for me to get the results back. If I don't have them in 2 weeks, we will call to try to get the results.   Once I have the results, we will call you to schedule a follow up visit with me to review them. Can do phone, MyChart, or in person.   I sent a prescription for oxycodone  to take for severe pain. This can make you sleepy and/or constipated. You should not drive or work while taking this medication.    I would follow up with PCP about abdominal swelling/weight gain.   Please do not hesitate to call if you have any questions or concerns. You can also message me in MyChart.   Glade Boys PA-C [phone]     The physicians and staff at [facility] are committed to providing excellent care. You may receive a survey asking for feedback about your experience at our office. We value you your feedback and appreciate you taking the time to to fill it out. The [facility] leadership team is also available to discuss your experience in person, feel free to contact us  [phone].

## 2024-04-09 DIAGNOSIS — M5416 Radiculopathy, lumbar region: Secondary | ICD-10-CM

## 2024-04-09 DIAGNOSIS — M47816 Spondylosis without myelopathy or radiculopathy, lumbar region: Secondary | ICD-10-CM

## 2024-04-09 MED ORDER — OXYCODONE HCL 5 MG PO TABS: 5.0000 mg | ORAL_TABLET | Freq: Three times a day (TID) | ORAL | 0 refills | Status: DC | PRN

## 2024-04-09 NOTE — Telephone Encounter (Signed)
 LOV 04/04/2024, last filled on 04/04/2024

## 2024-04-09 NOTE — Telephone Encounter (Signed)
 PMP reviewed and is appropriate.   Refill of oxycodone  sent to pharmacy.   Patient sent message.

## 2024-04-11 ENCOUNTER — Ambulatory Visit
Admission: RE | Admit: 2024-04-11 | Discharge: 2024-04-11 | Disposition: A | Source: Ambulatory Visit | Attending: Orthopedic Surgery | Admitting: Orthopedic Surgery

## 2024-04-11 DIAGNOSIS — M47816 Spondylosis without myelopathy or radiculopathy, lumbar region: Secondary | ICD-10-CM

## 2024-04-11 DIAGNOSIS — M5416 Radiculopathy, lumbar region: Secondary | ICD-10-CM

## 2024-04-16 MED ORDER — OXYCODONE HCL 5 MG PO TABS: 5.0000 mg | ORAL_TABLET | Freq: Three times a day (TID) | ORAL | 0 refills | Status: DC | PRN

## 2024-04-16 NOTE — Addendum Note (Signed)
 Addended byBETHA HILMA HASTINGS on: 04/16/2024 10:16 AM   Modules accepted: Orders

## 2024-04-16 NOTE — Telephone Encounter (Signed)
 PMP is reviewed and appropriate. Refill of oxycodone  sent to pharmacy. Patient sent message.

## 2024-04-17 NOTE — Progress Notes (Unsigned)
 "  Telephone Visit- Progress Note: Referring Physician:  No referring provider defined for this encounter.  Primary Physician:  Pcp, No  This visit was performed via telephone.  Patient location: home Provider location: office  I spent a total of 15 minutes non-face-to-face activities for this visit on the date of this encounter including review of current clinical condition and response to treatment.    Patient has given verbal consent to this telephone visits and we reviewed the limitations of a telephone visit. Patient wishes to proceed.    Chief Complaint:  review lumbar MRI  History of Present Illness: Douglas Garcia is a 44 y.o. male has a history of  HTN, left ventricular hypertrophy, anxiety, depression, prediabetes, pericarditis.   Last seen by me on 04/04/24 for constant right sided LBP with constant right leg pain (posterior to knee and anterior to foot). He has known lumbar spondylosis.   He was given limited prescription of oxycodone . No relief with previous prednisone , muscle relaxers, or neurontin.   He is about the same. He continues with constant right sided LBP with constant right  leg pain (posterior to knee and anterior to foot) to his foot. He has burning in right leg below his knee. He has weakness in right leg that is getting worse- he cannot stand on his heel. He has intermittent numbness in right toes. Pain is worse with walking, laying down, and stretching. He is resting better at night.   He is taking prn oxycodone .   He works at [employer].    Tobacco use: smokes 1/2 PPD x 30 years.    Bowel/Bladder Dysfunction: none   Conservative measures:  Physical therapy: has not participated for right leg pain Multimodal medical therapy including regular antiinflammatories:  Baclofen, Oxycodone , Percocet, Ibuprofen, Gabapentin, Robaxin , Prednisone  Injections: no epidural steroid injections   Past Surgery: no spine surgeries   The symptoms  are causing a significant impact on the patient's life.   Exam: No exam done as this was a telephone encounter.     Imaging: Lumbar MRI dated 04/11/24:  FINDINGS: Segmentation:  Standard.   Alignment:  Physiologic.   Vertebrae:  No fracture, evidence of discitis, or bone lesion.   Conus medullaris and cauda equina: Conus extends to the T12 level. Conus and cauda equina appear normal.   Paraspinal and other soft tissues: Negative.   Disc levels:   T12-L1: No significant disc protrusion, foraminal stenosis, or canal stenosis.   L1-L2: No significant disc protrusion, foraminal stenosis, or canal stenosis.   L2-L3: Annular disc bulge and mild facet hypertrophy. No significant canal stenosis. Mild-moderate bilateral foraminal stenosis.   L3-L4: Annular disc bulge and mild facet hypertrophy. No canal stenosis. Moderate bilateral foraminal stenosis.   L4-L5: Annular disc bulge with bilateral facet arthropathy. No canal stenosis. Moderate bilateral foraminal stenosis, right worse than left.   L5-S1: Annular disc bulge with shallow caudally extending central disc protrusion. Bilateral facet arthropathy. Bilateral foraminal stenosis is mild.   IMPRESSION: 1. Multilevel lumbar spondylosis with moderate bilateral foraminal stenosis at L3-L4 and L4-L5. 2. No significant canal stenosis at any level.     Electronically Signed   By: Mabel Converse D.O.   On: 04/13/2024 09:36   I have personally reviewed the images and agree with the above interpretation.  Assessment and Plan: Mr. Nadal continues with constant right sided LBP with constant right  leg pain (posterior to knee and anterior to foot) to his foot. He has burning in right leg  below his knee. He has weakness in right leg that is getting worse- he cannot stand on his heel. He has intermittent numbness in right toes.    He has known lumbar spondylosis with moderate bilateral foraminal stenosis L3-L4 and L4-L5. Also  with mild bilateral foraminal stenosis L5-S1.   Pain likely from L4-L5. He did not have weakness on his previous exam.    Treatment options discussed with patient and following plan made:   - Fast track referral to PMR at [facility] for right L4-L5 ESI.  - He was not able to come to see me today. Appointment scheduled for Monday to evaluate new weakness in right foot.  - Continue prn oxycodone . Refill sent to be picked up on 04/21/24. PMP reviewed and is appropriate.   Glade Boys PA-C Neurosurgery "

## 2024-04-19 ENCOUNTER — Ambulatory Visit (INDEPENDENT_AMBULATORY_CARE_PROVIDER_SITE_OTHER): Admitting: Orthopedic Surgery

## 2024-04-19 ENCOUNTER — Encounter: Payer: Self-pay | Admitting: Orthopedic Surgery

## 2024-04-19 DIAGNOSIS — M4726 Other spondylosis with radiculopathy, lumbar region: Secondary | ICD-10-CM | POA: Diagnosis not present

## 2024-04-19 DIAGNOSIS — M47816 Spondylosis without myelopathy or radiculopathy, lumbar region: Secondary | ICD-10-CM

## 2024-04-19 DIAGNOSIS — M48061 Spinal stenosis, lumbar region without neurogenic claudication: Secondary | ICD-10-CM

## 2024-04-19 DIAGNOSIS — M5416 Radiculopathy, lumbar region: Secondary | ICD-10-CM

## 2024-04-19 MED ORDER — OXYCODONE HCL 5 MG PO TABS: 5.0000 mg | ORAL_TABLET | Freq: Three times a day (TID) | ORAL | 0 refills | Status: DC | PRN

## 2024-04-19 NOTE — Progress Notes (Unsigned)
 "  Referring Physician:  No referring provider defined for this encounter.  Primary Physician:  Pcp, No  History of Present Illness: 04/19/2024 [name] has a history of HTN, left ventricular hypertrophy, anxiety, depression, prediabetes, pericarditis.   Seen in ED at [facility] (referred by [facility]) on 03/21/24 for back and right leg pain. He was sent home with robaxin , percocet 7.5, and tapered prednisone .   Seen back in ED at [facility] on 03/25/24. In the ED he had a trans gluteal nerve block on the right. He was referred to PT. He was given baclofen and oxycodone .   He has constant right sided LBP with constant right  leg pain (posterior to knee and anterior to foot) to his foot. LBP started a week ago, right leg pain started almost 3 weeks ago. No numbness or tingling, but right leg feels weak. He has pain with putting weight on his right leg due to pain. No known injury.   Pain is worse with walking, laying down, and stretching. He did not have any improvement with nerve block in ED. No alleviating factors.   After taking robaxin , percocet 7.5, and tapered prednisone  from first ED visit, he noted abdominal swelling and weight gain with red rash. He stopped prednisone  and this improved, but he thinks he still has some swelling.   He works at [employer].   Tobacco use: smokes 1/2 PPD x 30 years.   Bowel/Bladder Dysfunction: none  Conservative measures:  Physical therapy: has not participated for right leg pain Multimodal medical therapy including regular antiinflammatories:  Baclofen, Oxycodone , Percocet, Ibuprofen, Gabapentin, Robaxin , Prednisone  Injections: no epidural steroid injections  Past Surgery: no spine surgeries  Hoyle Barkdull has no symptoms of cervical myelopathy.  The symptoms are causing a significant impact on the patient's life.   Review of Systems:  A 10 point review of systems is negative, except for the pertinent positives and negatives  detailed in the HPI.  Past Medical History: Past Medical History:  Diagnosis Date   Anxiety    Cervical spinal stenosis    Depression    Hypertension    Pericarditis    Prediabetes     Past Surgical History: No past surgical history on file.  Allergies: Allergies as of 04/23/2024 - Review Complete 04/19/2024  Allergen Reaction Noted   Prednisone  Rash 04/04/2024    Medications: Outpatient Encounter Medications as of 04/23/2024  Medication Sig   [START ON 04/21/2024] oxyCODONE  (ROXICODONE ) 5 MG immediate release tablet Take 1 tablet (5 mg total) by mouth every 8 (eight) hours as needed for severe pain (pain score 7-10).   No facility-administered encounter medications on file as of 04/23/2024.    Social History: Social History[1]  Family Medical History: No family history on file.  Physical Examination: There were no vitals filed for this visit.   General: Patient is well developed, well nourished, calm, collected, and in no apparent distress. Attention to examination is appropriate.  Respiratory: Patient is breathing without any difficulty.   NEUROLOGICAL:     Awake, alert, oriented to person, place, and time.  Speech is clear and fluent. Fund of knowledge is appropriate.   Cranial Nerves: Pupils equal round and reactive to light.  Facial tone is symmetric.    No posterior lumbar tenderness. No tenderness over TL junction.   No abnormal lesions on exposed skin.   Strength: Side Biceps Triceps Deltoid Interossei Grip Wrist Ext. Wrist Flex.  R 5 5 5 5 5 5 5   L  5 5 5 5 5 5 5    Side Iliopsoas Quads Hamstring PF DF EHL  R 5 5 5 5 5 5   L 5 5 5 5 5 5    Reflexes are 1+ and symmetric at the biceps, brachioradialis, patella and achilles.   Hoffman's is absent.  Clonus is not present.   Bilateral upper and lower extremity sensation is intact to light touch.     No pain with IR/ER of both hips.   Gait is slow.   Medical Decision Making  Imaging: Lumbar xrays  dated 03/25/24:  FINDINGS:  There are 5 nonrib-bearing lumbar-type vertebral bodies. No evidence of static listhesis. Minimal anterior compression at L1. Vertebral body heights are otherwise well preserved. Anterolateral osteophytes most pronounced at L2-3 level Moderate colonic stool burden.  IMPRESSION: Age-indeterminate minimal anterior compression deformity of L1. Correlate with history and point tenderness.  Electronically Reviewed by: Wanna Hoit, MD, [facility] Electronically Reviewed on: 03/25/2024 4:50 PM   I have personally reviewed the images and agree with the above interpretation.  Assessment and Plan: Mr. Lento has constant right sided LBP with constant right  leg pain (posterior to knee and anterior to foot) to his foot. LBP started a week ago, right leg pain started almost 3 weeks ago. No numbness or tingling, but right leg feels weak.   Lumbar xrays show spondylosis. I don't appreciated any significant compression of L1, but agree it appears old. Exam not consistent with acute compression fracture.   Treatment options discussed with patient and following plan made:   - MRI of lumbar spine to further evaluate right leg pain. Will also evaluate possible L1 compression.  - No relief with prednisone  or muscle relaxers (with question of allergic reaction).  - Limited script for oxycodone  given. Reviewed dosing and side effects. Do not drive or work while taking this medication. PMP reviewed and is appropriate.  - Discussed neurontin and he declines. Did not help in the past.  - Depending on MRI results, may consider injections and/or PT. Will plan for in person follow up with me once I have results back to review them.  - He is working for now, but will let me know if he needs to be pulled.  Of note, after taking robaxin , percocet 7.5, and tapered prednisone  from first ED visit, he noted abdominal swelling and weight gain with red rash. He stopped prednisone  and this  improved, but he thinks he still has some swelling. Prednisone  added as possible allergy and he was advised to follow up with PCP.   I spent a total of 45 minutes in face-to-face and non-face-to-face activities related to this patient's care today including review of outside records, review of imaging, review of symptoms, physical exam, discussion of differential diagnosis, discussion of treatment options, and documentation.   Thank you for involving me in the care of this patient.   Glade Boys PA-C Dept. of Neurosurgery      [1]  Social History Tobacco Use   Smoking status: Every Day    Types: Cigarettes   Smokeless tobacco: Never  Vaping Use   Vaping status: Never Used  Substance Use Topics   Alcohol use: Not Currently   Drug use: Not Currently   "

## 2024-04-20 ENCOUNTER — Encounter: Payer: Self-pay | Admitting: Orthopedic Surgery

## 2024-04-20 ENCOUNTER — Telehealth: Admitting: Orthopedic Surgery

## 2024-04-20 NOTE — Progress Notes (Unsigned)
 "  Referring Physician:  No referring provider defined for this encounter.  Primary Physician:  Pcp, No  History of Present Illness: Douglas Garcia has a history of HTN, left ventricular hypertrophy, anxiety, depression, prediabetes, pericarditis.   He had phone visit with me on 04/19/24- he has known lumbar spondylosis with moderate bilateral foraminal stenosis L3-L4 and L4-L5. Also with mild bilateral foraminal stenosis L5-S1.   Fast track referral done to PMR for right L4-L5 ESI. He continues on prn oxycodone .   He felt like he had developed weakness in his right foot and I wanted to see him for evaluation.       He is about the same. He continues with constant right sided LBP with constant right  leg pain (posterior to knee and anterior to foot) to his foot. He has burning in right leg below his knee. He has weakness in right leg that is getting worse- he cannot stand on his heel. He has intermittent numbness in right toes. Pain is worse with walking, laying down, and stretching. He is resting better at night.   He works at [employer].   Tobacco use: smokes 1/2 PPD x 30 years.   Bowel/Bladder Dysfunction: none  Conservative measures:  Physical therapy: has not participated for right leg pain Multimodal medical therapy including regular antiinflammatories:  Baclofen, Oxycodone , Percocet, Ibuprofen, Gabapentin , Robaxin , Prednisone  Injections: no epidural steroid injections  Past Surgery: no spine surgeries  Layla Gramm has no symptoms of cervical myelopathy.  The symptoms are causing a significant impact on the patient's life.   Review of Systems:  A 10 point review of systems is negative, except for the pertinent positives and negatives detailed in the HPI.  Past Medical History: Past Medical History:  Diagnosis Date   Anxiety    Cervical spinal stenosis    Depression    Hypertension    Pericarditis    Prediabetes     Past Surgical  History: No past surgical history on file.  Allergies: Allergies as of 04/25/2024 - Review Complete 04/19/2024  Allergen Reaction Noted   Prednisone  Rash 04/04/2024    Medications: Outpatient Encounter Medications as of 04/25/2024  Medication Sig   [START ON 04/21/2024] oxyCODONE  (ROXICODONE ) 5 MG immediate release tablet Take 1 tablet (5 mg total) by mouth every 8 (eight) hours as needed for severe pain (pain score 7-10).   No facility-administered encounter medications on file as of 04/25/2024.    Social History: Social History[1]  Family Medical History: No family history on file.  Physical Examination: There were no vitals filed for this visit.  Awake, alert, oriented to person, place, and time.  Speech is clear and fluent. Fund of knowledge is appropriate.   Cranial Nerves: Pupils equal round and reactive to light.  Facial tone is symmetric.    No posterior lumbar tenderness. No tenderness over TL junction.   No abnormal lesions on exposed skin.   Strength: Side Biceps Triceps Deltoid Interossei Grip Wrist Ext. Wrist Flex.  R 5 5 5 5 5 5 5   L 5 5 5 5 5 5 5    Side Iliopsoas Quads Hamstring PF DF EHL  R 5 5 5 5 5 5   L 5 5 5 5 5 5    Reflexes are 1+ and symmetric at the biceps, brachioradialis, patella and achilles.   Hoffman's is absent.  Clonus is not present.   Bilateral upper and lower extremity sensation is intact to light touch.     No pain  with IR/ER of both hips.   Gait is slow.   Medical Decision Making  Imaging: none  Assessment and Plan: Mr. Stockert continues with constant right sided LBP with constant right  leg pain (posterior to knee and anterior to foot) to his foot. He has burning in right leg below his knee. He has weakness in right leg that is getting worse- he cannot stand on his heel. He has intermittent numbness in right toes.    He has known lumbar spondylosis with moderate bilateral foraminal stenosis L3-L4 and L4-L5. Also with mild  bilateral foraminal stenosis L5-S1.    Pain likely from L4-L5. He did not have weakness on his previous exam.    Treatment options discussed with patient and following plan made:    - Fast track referral to PMR at [facility] for right L4-L5 ESI.  - He was not able to come to see me today. Appointment scheduled for Monday to evaluate new weakness in right foot.  - Continue prn oxycodone . Refill sent to be picked up on 04/21/24. PMP reviewed and is appropriate.   I spent a total of 45 minutes in face-to-face and non-face-to-face activities related to this patient's care today including review of outside records, review of imaging, review of symptoms, physical exam, discussion of differential diagnosis, discussion of treatment options, and documentation.   Glade Boys PA-C Dept. of Neurosurgery      [1]  Social History Tobacco Use   Smoking status: Every Day    Types: Cigarettes   Smokeless tobacco: Never  Vaping Use   Vaping status: Never Used  Substance Use Topics   Alcohol use: Not Currently   Drug use: Not Currently   "

## 2024-04-23 ENCOUNTER — Ambulatory Visit: Admitting: Orthopedic Surgery

## 2024-04-25 ENCOUNTER — Ambulatory Visit: Admitting: Orthopedic Surgery

## 2024-04-26 NOTE — Progress Notes (Unsigned)
 "  Referring Physician:  No referring provider defined for this encounter.  Primary Physician:  Pcp, No  History of Present Illness: [name] has a history of HTN, left ventricular hypertrophy, anxiety, depression, prediabetes, pericarditis.   He had phone visit with me on 04/19/24- he has known lumbar spondylosis with moderate bilateral foraminal stenosis L3-L4 and L4-L5. Also with mild bilateral foraminal stenosis L5-S1.   Fast track referral done to PMR for right L4-L5 ESI. He continues on prn oxycodone .   He felt like he had developed weakness in his right foot and I wanted to see him for evaluation. Pain started about 6 weeks ago with no injury. He woke up with this pain.   He has intermittent right sided upper lumbar back pain with constant right  leg pain (posterior to knee and anterior to foot) to his foot. LBP < right leg pain. He has burning in right leg below his knee. He has weakness in right leg that is getting worse. He has numbness in right toes and tingling in right leg. He feels like he is losing muscle mass He has intermittent numbness in right toes.   He feels like overall that his pain is a little better, but his weakness in worse.   He works at [employer].   Tobacco use: smokes 1/2 PPD x 30 years.   Bowel/Bladder Dysfunction: none  Conservative measures:  Physical therapy: has not participated for right leg pain Multimodal medical therapy including regular antiinflammatories:  Baclofen, Oxycodone , Percocet, Ibuprofen, Gabapentin , Robaxin , Prednisone  Injections: no epidural steroid injections  Past Surgery: no spine surgeries  Douglas Garcia has no symptoms of cervical myelopathy.  The symptoms are causing a significant impact on the patient's life.   Review of Systems:  A 10 point review of systems is negative, except for the pertinent positives and negatives detailed in the HPI.  Past Medical History: Past Medical History:   Diagnosis Date   Anxiety    Cervical spinal stenosis    Depression    Hypertension    Pericarditis    Prediabetes     Past Surgical History: History reviewed. No pertinent surgical history.  Allergies: Allergies as of 04/27/2024 - Review Complete 04/27/2024  Allergen Reaction Noted   Prednisone  Rash 04/04/2024    Medications: Outpatient Encounter Medications as of 04/27/2024  Medication Sig   oxyCODONE  (ROXICODONE ) 5 MG immediate release tablet Take 1 tablet (5 mg total) by mouth every 8 (eight) hours as needed for severe pain (pain score 7-10).   No facility-administered encounter medications on file as of 04/27/2024.    Social History: Social History[1]  Family Medical History: History reviewed. No pertinent family history.  Physical Examination: Vitals:   04/27/24 0808 04/27/24 0838  BP: (!) 152/94 (!) 144/96    Awake, alert, oriented to person, place, and time.  Speech is clear and fluent. Fund of knowledge is appropriate.   Cranial Nerves: Pupils equal round and reactive to light.  Facial tone is symmetric.    No posterior lumbar tenderness. No tenderness over TL junction.   No abnormal lesions on exposed skin.   Strength:  Side Iliopsoas Quads Hamstring PF DF EHL  R 5 5 5 4  4- 4-  L 5 5 5 5 5 5    He can toe stand on left foot. He can toe stand on right foot, but has difficulty. He cannot heel stand on right foot. He can heel stand on left foot.   He has 5/5  strength in bilateral foot eversion and inversion.   Reflexes are 1+ and symmetric at the patella and achilles on left. He has no patella DTR on right and  trace on achilles DTR.   Clonus is not present.   Bilateral upper and lower extremity sensation is intact to light touch.     No pain with IR/ER of both hips.   Gait is slow.   Medical Decision Making  Imaging: none  Assessment and Plan: [name] has intermittent right sided upper lumbar back pain with constant right  leg pain  (posterior to knee and anterior to foot) to his foot. LBP < right leg pain. He has weakness in right leg that is getting worse. He feels like he is losing muscle mass He has intermittent numbness in right toes.   He feels like pain has improved, but weakness is getting worse.    He has known lumbar spondylosis with moderate bilateral foraminal stenosis L3-L4 and L4-L5. Also with mild bilateral foraminal stenosis L5-S1.   Above MRI findings would not fully explain his weakness and absent patellar reflex on right.   Dr. Claudene was in to examine patient today as well.    Treatment options discussed with patient  by Dr. Claudene and following plan made:   - EMG/NCS of bilateral lower extremities to evaluate for lumbar radiculopathy versus plexopathy. Orders to [facility].  - Okay to have lumbar ESI as scheduled on 05/10/24 with [facility].  - Continue prn oxycodone . Directions changed to q 12 hours prn severe pain. PMP reviewed and is appropriate. Refill sent.  - Started on neurontin  300mg  q hs. Reviewed this can take 1-3 weeks to get in his system and help. Reviewed side effects.   - Will plan to follow up with him to review EMG results.   BP was elevated. No symptoms of chest pain, shortness of breath, blurry vision, or headaches. He will recheck at home and call PCP if not improved. If he develops CP, SOB, blurry vision, or headaches, then he will go to ED. Message sent to PCP to advise them of elevated BP reading.    I spent a total of 35 minutes in face-to-face and non-face-to-face activities related to this patient's care today including review of outside records, review of imaging, review of symptoms, physical exam, discussion of differential diagnosis, discussion of treatment options, and documentation.   Glade Boys PA-C Dept. of Neurosurgery      [1]  Social History Tobacco Use   Smoking status: Every Day    Types: Cigarettes   Smokeless tobacco: Never  Vaping Use   Vaping status: Never Used   Substance Use Topics   Alcohol use: Not Currently   Drug use: Not Currently   "

## 2024-04-27 ENCOUNTER — Encounter: Payer: Self-pay | Admitting: Orthopedic Surgery

## 2024-04-27 ENCOUNTER — Ambulatory Visit: Admitting: Orthopedic Surgery

## 2024-04-27 VITALS — BP 144/96 | Ht 73.0 in | Wt 237.0 lb

## 2024-04-27 DIAGNOSIS — M47816 Spondylosis without myelopathy or radiculopathy, lumbar region: Secondary | ICD-10-CM

## 2024-04-27 DIAGNOSIS — R29898 Other symptoms and signs involving the musculoskeletal system: Secondary | ICD-10-CM

## 2024-04-27 DIAGNOSIS — M5416 Radiculopathy, lumbar region: Secondary | ICD-10-CM

## 2024-04-27 DIAGNOSIS — M48061 Spinal stenosis, lumbar region without neurogenic claudication: Secondary | ICD-10-CM

## 2024-04-27 MED ORDER — OXYCODONE HCL 5 MG PO TABS: 5.0000 mg | ORAL_TABLET | Freq: Two times a day (BID) | ORAL | 0 refills | Status: AC | PRN

## 2024-04-27 MED ORDER — GABAPENTIN 300 MG PO CAPS: 300.0000 mg | ORAL_CAPSULE | Freq: Every day | ORAL | 0 refills | Status: AC

## 2024-04-27 NOTE — Patient Instructions (Addendum)
 It was so nice to see you today. Thank you so much for coming in.    You have some wear and tear in your back, but we are not sure that would explain the weakness in your right leg.   Dr. Claudene wants to get an EMG (nerve conduction test) to look into things further. I have ordered this and [facility] will call you to schedule. You can also call them at [phone].   I have sent a refill of oxycodone  to your pharmacy. Continue to take only as needed for severe pain.   I have sent gabapentin  to your pharmacy to help with nerve pain. Take as directed at night. Call if any side side effects.   Okay to get lumbar injection as scheduled on 05/10/24.   We will plan to follow up after your nerve test.   Your blood pressure was elevated today. I want you to recheck it at home and follow up with your PCP if it remains high. If you have any chest pain, shortness of breath, blurry vision, or headaches then you need to go to ED.   Please do not hesitate to call if you have any questions or concerns. You can also message me in MyChart.   Glade Boys PA-C [phone]     The physicians and staff at [facility] at [facility] are committed to providing excellent care. You may receive a survey asking for feedback about your experience at our office. We value you your feedback and appreciate you taking the time to to fill it out. The [facility] leadership team is also available to discuss your experience in person, feel free to contact us  [phone].

## 2024-05-16 ENCOUNTER — Ambulatory Visit: Admitting: Orthopedic Surgery
